# Patient Record
Sex: Female | Born: 1960 | Race: White | Hispanic: No | Smoking: Former smoker
Health system: Southern US, Community
[De-identification: ages and names within clinical notes are randomized; demographics above are authoritative.]

## PROBLEM LIST (undated history)

## (undated) DIAGNOSIS — B009 Herpesviral infection, unspecified: Secondary | ICD-10-CM

## (undated) DIAGNOSIS — R0602 Shortness of breath: Secondary | ICD-10-CM

## (undated) DIAGNOSIS — C801 Malignant (primary) neoplasm, unspecified: Secondary | ICD-10-CM

## (undated) DIAGNOSIS — F419 Anxiety disorder, unspecified: Secondary | ICD-10-CM

## (undated) DIAGNOSIS — J310 Chronic rhinitis: Secondary | ICD-10-CM

## (undated) DIAGNOSIS — F411 Generalized anxiety disorder: Secondary | ICD-10-CM

## (undated) DIAGNOSIS — I10 Essential (primary) hypertension: Secondary | ICD-10-CM

## (undated) DIAGNOSIS — R0789 Other chest pain: Secondary | ICD-10-CM

## (undated) HISTORY — PX: WISDOM TOOTH EXTRACTION: SHX21

## (undated) HISTORY — DX: Other chest pain: R07.89

## (undated) HISTORY — DX: Generalized anxiety disorder: F41.1

## (undated) HISTORY — DX: Anxiety disorder, unspecified: F41.9

## (undated) HISTORY — PX: TONSILLECTOMY: SUR1361

## (undated) HISTORY — DX: Shortness of breath: R06.02

## (undated) HISTORY — DX: Herpesviral infection, unspecified: B00.9

## (undated) HISTORY — PX: KNEE SURGERY: SHX244

## (undated) HISTORY — DX: Chronic rhinitis: J31.0

## (undated) HISTORY — PX: ABDOMINAL HYSTERECTOMY: SHX81

## (undated) HISTORY — DX: Essential (primary) hypertension: I10

---

## 1998-06-27 ENCOUNTER — Emergency Department (HOSPITAL_COMMUNITY): Admission: EM | Admit: 1998-06-27 | Discharge: 1998-06-27 | Payer: Self-pay | Admitting: Emergency Medicine

## 1998-06-28 ENCOUNTER — Encounter: Payer: Self-pay | Admitting: Emergency Medicine

## 1998-06-28 ENCOUNTER — Ambulatory Visit (HOSPITAL_COMMUNITY): Admission: RE | Admit: 1998-06-28 | Discharge: 1998-06-28 | Payer: Self-pay | Admitting: Emergency Medicine

## 1998-11-20 ENCOUNTER — Other Ambulatory Visit: Admission: RE | Admit: 1998-11-20 | Discharge: 1998-11-20 | Payer: Self-pay | Admitting: Obstetrics and Gynecology

## 1998-12-20 ENCOUNTER — Ambulatory Visit (HOSPITAL_COMMUNITY): Admission: RE | Admit: 1998-12-20 | Discharge: 1998-12-20 | Payer: Self-pay | Admitting: Obstetrics and Gynecology

## 1998-12-20 ENCOUNTER — Encounter (INDEPENDENT_AMBULATORY_CARE_PROVIDER_SITE_OTHER): Payer: Self-pay

## 1999-02-06 ENCOUNTER — Inpatient Hospital Stay (HOSPITAL_COMMUNITY): Admission: RE | Admit: 1999-02-06 | Discharge: 1999-02-09 | Payer: Self-pay | Admitting: Obstetrics and Gynecology

## 1999-02-06 ENCOUNTER — Encounter (INDEPENDENT_AMBULATORY_CARE_PROVIDER_SITE_OTHER): Payer: Self-pay | Admitting: Specialist

## 2000-07-07 ENCOUNTER — Other Ambulatory Visit: Admission: RE | Admit: 2000-07-07 | Discharge: 2000-07-07 | Payer: Self-pay | Admitting: Obstetrics and Gynecology

## 2003-02-23 ENCOUNTER — Other Ambulatory Visit: Admission: RE | Admit: 2003-02-23 | Discharge: 2003-02-23 | Payer: Self-pay | Admitting: Obstetrics and Gynecology

## 2007-12-02 ENCOUNTER — Encounter: Admission: RE | Admit: 2007-12-02 | Discharge: 2007-12-02 | Payer: Self-pay | Admitting: Family Medicine

## 2007-12-03 ENCOUNTER — Ambulatory Visit (HOSPITAL_COMMUNITY): Admission: RE | Admit: 2007-12-03 | Discharge: 2007-12-03 | Payer: Self-pay | Admitting: Gastroenterology

## 2008-09-27 ENCOUNTER — Encounter: Admission: RE | Admit: 2008-09-27 | Discharge: 2008-09-27 | Payer: Self-pay | Admitting: Family Medicine

## 2010-07-22 ENCOUNTER — Encounter: Payer: Self-pay | Admitting: Family Medicine

## 2012-08-25 ENCOUNTER — Encounter (HOSPITAL_BASED_OUTPATIENT_CLINIC_OR_DEPARTMENT_OTHER): Payer: Self-pay | Admitting: Family Medicine

## 2012-08-25 ENCOUNTER — Emergency Department (HOSPITAL_BASED_OUTPATIENT_CLINIC_OR_DEPARTMENT_OTHER)
Admission: EM | Admit: 2012-08-25 | Discharge: 2012-08-25 | Disposition: A | Discharge status: Home / Self Care | Payer: Federal, State, Local not specified - PPO | Attending: Emergency Medicine | Admitting: Emergency Medicine

## 2012-08-25 ENCOUNTER — Emergency Department (HOSPITAL_BASED_OUTPATIENT_CLINIC_OR_DEPARTMENT_OTHER): Payer: Federal, State, Local not specified - PPO

## 2012-08-25 DIAGNOSIS — F17200 Nicotine dependence, unspecified, uncomplicated: Secondary | ICD-10-CM | POA: Insufficient documentation

## 2012-08-25 DIAGNOSIS — M549 Dorsalgia, unspecified: Secondary | ICD-10-CM

## 2012-08-25 DIAGNOSIS — Z859 Personal history of malignant neoplasm, unspecified: Secondary | ICD-10-CM | POA: Insufficient documentation

## 2012-08-25 DIAGNOSIS — M545 Low back pain, unspecified: Secondary | ICD-10-CM | POA: Insufficient documentation

## 2012-08-25 DIAGNOSIS — Z90710 Acquired absence of both cervix and uterus: Secondary | ICD-10-CM | POA: Insufficient documentation

## 2012-08-25 DIAGNOSIS — Z79899 Other long term (current) drug therapy: Secondary | ICD-10-CM | POA: Insufficient documentation

## 2012-08-25 HISTORY — DX: Malignant (primary) neoplasm, unspecified: C80.1

## 2012-08-25 LAB — URINALYSIS, ROUTINE W REFLEX MICROSCOPIC
Glucose, UA: NEGATIVE mg/dL
Leukocytes, UA: NEGATIVE
Nitrite: NEGATIVE
Protein, ur: NEGATIVE mg/dL
pH: 7 (ref 5.0–8.0)

## 2012-08-25 LAB — CBC WITH DIFFERENTIAL/PLATELET
Basophils Absolute: 0 10*3/uL (ref 0.0–0.1)
Basophils Relative: 0 % (ref 0–1)
Lymphocytes Relative: 31 % (ref 12–46)
Neutro Abs: 5.1 10*3/uL (ref 1.7–7.7)
Platelets: 253 10*3/uL (ref 150–400)
RDW: 11.9 % (ref 11.5–15.5)
WBC: 8.4 10*3/uL (ref 4.0–10.5)

## 2012-08-25 LAB — BASIC METABOLIC PANEL
CO2: 27 mEq/L (ref 19–32)
Calcium: 10 mg/dL (ref 8.4–10.5)
Chloride: 103 mEq/L (ref 96–112)
GFR calc Af Amer: >90 mL/min (ref >90)
Sodium: 141 mEq/L (ref 135–145)

## 2012-08-25 MED ORDER — IBUPROFEN 800 MG PO TABS: 800.0000 mg | ORAL_TABLET | Freq: Three times a day (TID) | ORAL | Status: AC

## 2012-08-25 MED ORDER — ONDANSETRON HCL 4 MG/2ML IJ SOLN
4.0000 mg | Freq: Once | INTRAMUSCULAR | Status: AC
  Administered 2012-08-25: 4 mg via INTRAVENOUS
  Filled 2012-08-25: qty 2

## 2012-08-25 MED ORDER — CYCLOBENZAPRINE HCL 10 MG PO TABS: 10.0000 mg | ORAL_TABLET | Freq: Two times a day (BID) | ORAL | Status: DC | PRN

## 2012-08-25 MED ORDER — TRAMADOL HCL 50 MG PO TABS: 50.0000 mg | ORAL_TABLET | Freq: Four times a day (QID) | ORAL | Status: DC | PRN

## 2012-08-25 MED ORDER — SODIUM CHLORIDE 0.9 % IV SOLN
INTRAVENOUS | Status: DC
  Administered 2012-08-25: 10:00:00 via INTRAVENOUS

## 2012-08-25 MED ORDER — MORPHINE SULFATE 4 MG/ML IJ SOLN
4.0000 mg | Freq: Once | INTRAMUSCULAR | Status: AC
  Administered 2012-08-25: 4 mg via INTRAVENOUS
  Filled 2012-08-25: qty 1

## 2012-08-25 NOTE — ED Provider Notes (Signed)
History     CSN: [id number]  Arrival date & time 08/25/12  1191   First MD Initiated Contact with Patient 08/25/12 [phone]      Chief Complaint  Patient presents with  . Flank Pain    (Consider location/radiation/quality/duration/timing/severity/associated sxs/prior treatment) HPI Comments: Patient presents to the ER for evaluation of left lower back pain for 2 days. Patient denies any injury. She has noticed that certain positions exacerbate the pain. Pain does not radiate to the leg, but does come around and the left lower abdominal area. She has not had any nausea, vomiting or diarrhea. She denies fever. She has not noticed any urinary symptoms. Pain is worse today than it has been, sharp and stabbing.  Patient is a 52 y.o. female presenting with flank pain.  Flank Pain    Past Medical History  Diagnosis Date  . Cancer     Past Surgical History  Procedure Laterality Date  . Abdominal hysterectomy      No family history on file.  History  Substance Use Topics  . Smoking status: Current Some Day Smoker  . Smokeless tobacco: Not on file  . Alcohol Use: Yes    OB History   Grav Para Term Preterm Abortions TAB SAB Ect Mult Living                  Review of Systems  Genitourinary: Positive for flank pain.  Musculoskeletal: Positive for back pain.  All other systems reviewed and are negative.    Allergies  Penicillins and Sulfa antibiotics  Home Medications   Current Outpatient Rx  Name  Route  Sig  Dispense  Refill  . loratadine (CLARITIN) 10 MG tablet   Oral   Take 10 mg by mouth daily.           BP 144/102  Pulse 104  Temp(Src) 97.7 F (36.5 C) (Oral)  Resp 16  SpO2 98%  Physical Exam  Constitutional: She is oriented to person, place, and time. She appears well-developed and well-nourished. No distress.  HENT:  Head: Normocephalic and atraumatic.  Right Ear: Hearing normal.  Nose: Nose normal.  Mouth/Throat: Oropharynx is clear and moist  and mucous membranes are normal.  Eyes: Conjunctivae and EOM are normal. Pupils are equal, round, and reactive to light.  Neck: Normal range of motion. Neck supple.  Cardiovascular: Normal rate, regular rhythm, S1 normal and S2 normal.  Exam reveals no gallop and no friction rub.   No murmur heard. Pulmonary/Chest: Effort normal and breath sounds normal. No respiratory distress. She exhibits no tenderness.  Abdominal: Soft. Normal appearance and bowel sounds are normal. There is no hepatosplenomegaly. There is no tenderness. There is no rebound, no guarding, no tenderness at McBurney's point and negative Murphy's sign. No hernia.  Musculoskeletal: Normal range of motion.       Left hip: She exhibits no tenderness and no deformity.       Legs: Neurological: She is alert and oriented to person, place, and time. She has normal strength. No cranial nerve deficit or sensory deficit. Coordination normal. GCS eye subscore is 4. GCS verbal subscore is 5. GCS motor subscore is 6.  Skin: Skin is warm, dry and intact. No rash noted. No cyanosis.  Psychiatric: She has a normal mood and affect. Her speech is normal and behavior is normal. Thought content normal.    ED Course  Procedures (including critical care time)  Labs Reviewed  BASIC METABOLIC PANEL - Abnormal; Notable for the  following:    GFR calc non Af Amer 82 (*)    All other components within normal limits  CBC WITH DIFFERENTIAL  URINALYSIS, ROUTINE W REFLEX MICROSCOPIC   Ct Abdomen Pelvis Wo Contrast  08/25/2012  *RADIOLOGY REPORT*  Clinical Data: Left flank pain.  CT ABDOMEN AND PELVIS WITHOUT CONTRAST  Technique:  Multidetector CT imaging of the abdomen and pelvis was performed following the standard protocol without intravenous contrast.  Comparison: None.  Findings: Lung bases show no acute findings.  Heart size normal. No pericardial or pleural effusion.  Liver, gallbladder, adrenal glands, kidneys, spleen, pancreas, stomach and bowel  are unremarkable.  No urinary stones or obstruction.  No free fluid.  No pathologically enlarged lymph nodes.  Minimal scattered atherosclerotic calcification of the arterial vasculature without abdominal aortic aneurysm.  No worrisome lytic or sclerotic lesions.  IMPRESSION: Negative exam.   Original Report Authenticated By: Leanna Battles, M.D.      Diagnosis: Back pain    MDM  Patient presents to the ER for evaluation of left lower back pain has been present for 2 days. Symptoms are worsening. Patient has had some exacerbation with changes in position which supports the diagnosis of musculoskeletal pain. Pain does, however, radiate around towards the front as well. There was some consideration for kidney stone. Urinalysis and lab work were unremarkable. CT scan was performed and no acute abnormalities were seen. Patient treated with morphine here in the ER will be treated with continued analgesia and rest as an outpatient.        Gilda Crease, MD 08/25/12 1051

## 2012-08-25 NOTE — ED Notes (Signed)
Pt c/o left flank pain x 2 days. Denies injury, denies urinary symptoms, denies n/v.

## 2013-04-28 ENCOUNTER — Other Ambulatory Visit: Payer: Self-pay | Admitting: Family Medicine

## 2013-04-28 ENCOUNTER — Other Ambulatory Visit: Payer: Self-pay

## 2013-04-28 DIAGNOSIS — Z1231 Encounter for screening mammogram for malignant neoplasm of breast: Secondary | ICD-10-CM

## 2013-06-01 ENCOUNTER — Ambulatory Visit: Payer: Federal, State, Local not specified - PPO

## 2018-04-30 ENCOUNTER — Telehealth: Payer: Self-pay

## 2018-04-30 NOTE — Telephone Encounter (Signed)
SENT REFERRAL TO SCHEDULING AND FILED NOTES 

## 2018-05-04 ENCOUNTER — Encounter: Payer: Self-pay | Admitting: Cardiovascular Disease

## 2018-05-05 ENCOUNTER — Encounter (INDEPENDENT_AMBULATORY_CARE_PROVIDER_SITE_OTHER): Payer: Self-pay

## 2018-05-05 ENCOUNTER — Encounter: Payer: Self-pay | Admitting: Cardiovascular Disease

## 2018-05-05 ENCOUNTER — Ambulatory Visit: Payer: Federal, State, Local not specified - PPO | Admitting: Cardiovascular Disease

## 2018-05-05 VITALS — BP 132/80 | HR 64 | Ht 64.0 in | Wt 167.4 lb

## 2018-05-05 DIAGNOSIS — R072 Precordial pain: Secondary | ICD-10-CM | POA: Diagnosis not present

## 2018-05-05 LAB — BASIC METABOLIC PANEL
BUN/Creatinine Ratio: 25 — ABNORMAL HIGH (ref 9–23)
BUN: 20 mg/dL (ref 6–24)
CO2: 26 mmol/L (ref 20–29)
CREATININE: 0.81 mg/dL (ref 0.57–1.00)
Calcium: 9.9 mg/dL (ref 8.7–10.2)
Chloride: 97 mmol/L (ref 96–106)
GFR calc Af Amer: 92 mL/min/{1.73_m2} (ref >59)
GFR calc non Af Amer: 80 mL/min/{1.73_m2} (ref >59)
GLUCOSE: 94 mg/dL (ref 65–99)
Potassium: 4.3 mmol/L (ref 3.5–5.2)
SODIUM: 137 mmol/L (ref 134–144)

## 2018-05-05 NOTE — Patient Instructions (Addendum)
Medication Instructions:  Your physician recommends that you continue on your current medications as directed. Please refer to the Current Medication list given to you today.  If you need a refill on your cardiac medications before your next appointment, please call your pharmacy.   Lab work: Lab work to be done today--BMP If you have labs (blood work) drawn today and your tests are completely normal, you will receive your results only by: Marland Kitchen MyChart Message (if you have MyChart) OR . A paper copy in the mail If you have any lab test that is abnormal or we need to change your treatment, we will call you to review the results.  Testing/Procedures: Your physician has requested that you have an echocardiogram. Echocardiography is a painless test that uses sound waves to create images of your heart. It provides your doctor with information about the size and shape of your heart and how well your heart's chambers and valves are working. This procedure takes approximately one hour. There are no restrictions for this procedure.  Your physician has requested that you have cardiac CT. Cardiac computed tomography (CT) is a painless test that uses an x-ray machine to take clear, detailed pictures of your heart. For further information please visit [email or web address]. Please follow instructions below.     Follow-Up:  Your physician recommends that you schedule a follow-up appointment in:  6-8 weeks with PA/NP on Dr. Camillia Herter care team    Any Other Special Instructions Will Be Listed Below (If Applicable). Please arrive at the [facility] main entrance of [facility] at    AM (30-45 minutes prior to test start time)  [facility] [place], [place] [address] [phone]  Proceed to the [facility] Radiology Department (First Floor).  Please follow these instructions carefully (unless otherwise directed):  Hold all erectile dysfunction medications at least  48 hours prior to test.  On the Night Before the Test: . Be sure to Drink plenty of water. . Do not consume any caffeinated/decaffeinated beverages or chocolate 12 hours prior to your test. . Do not take any antihistamines 12 hours prior to your test. . If you take Metformin do not take 24 hours prior to test. On the Day of the Test: . Drink plenty of water. Do not drink any water within one hour of the test. . Do not eat any food 4 hours prior to the test.   . Take 2 metoprolol tablets by mouth 2 hours prior to test . HOLD Hydrochlorothiazide morning of the test.         After the Test: . Drink plenty of water. . After receiving IV contrast, you may experience a mild flushed feeling. This is normal. . On occasion, you may experience a mild rash up to 24 hours after the test. This is not dangerous. If this occurs, you can take Benadryl 25 mg and increase your fluid intake. . If you experience trouble breathing, this can be serious. If it is severe call 911 IMMEDIATELY. If it is mild, please call our office. . If you take any of these medications: Glipizide/Metformin, Avandament, Glucavance, please do not take 48 hours after completing test.

## 2018-05-05 NOTE — Progress Notes (Signed)
Chief Complaint  Patient presents with  . New Patient (Initial Visit)    chest pain   History of Present Illness: 57 yo female with history of anxiety and HTN who is here today as a new consult referred by Bing Matter, PA-C for the evaluation of chest pain. She has no prior cardiac disease. She has been having a pressure in her chest at rest and with exertion. This is  associated sometimes after meals. She has been having dyspnea with exertion. No lower extremity edema. Her father and sister have CAD. Her mother had carotid artery disease. She smoked several cigarettes per day on/off for many years. She no longer smokes.   Primary Care Physician: Aletha Halim., PA-CKristen Mila Merry   Past Medical History:  Diagnosis Date  . Anxiety   . Cancer (HCC)    Ovarian  . Chest pressure   . GAD (generalized anxiety disorder)   . Herpes simplex type 1 infection   . Hypertension   . Rhinitis   . SOB (shortness of breath)     Past Surgical History:  Procedure Laterality Date  . ABDOMINAL HYSTERECTOMY    . KNEE SURGERY    . TONSILLECTOMY    . WISDOM TOOTH EXTRACTION      Current Outpatient Medications  Medication Sig Dispense Refill  . ALPRAZolam (XANAX) 0.5 MG tablet Take 0.5 mg by mouth 2 (two) times daily as needed for anxiety.    Marland Kitchen aspirin EC 81 MG tablet Take 81 mg by mouth daily.    Marland Kitchen doxycycline (VIBRAMYCIN) 100 MG capsule Take 100 mg by mouth 2 (two) times daily.    . hydrochlorothiazide (HYDRODIURIL) 25 MG tablet Take 25 mg by mouth daily.    Marland Kitchen ibuprofen (ADVIL,MOTRIN) 800 MG tablet Take 1 tablet (800 mg total) by mouth 3 (three) times daily. 21 tablet 0  . KRILL OIL PO Take by mouth.     . loratadine (CLARITIN) 10 MG tablet Take 10 mg by mouth daily.    . metoprolol succinate (TOPROL-XL) 50 MG 24 hr tablet Take 50 mg by mouth daily. Take with or immediately following a meal.     No current facility-administered medications for this visit.     Allergies    Allergen Reactions  . Penicillins   . Sulfa Antibiotics     Social History   Socioeconomic History  . Marital status: Widowed    Spouse name: Not on file  . Number of children: Not on file  . Years of education: Not on file  . Highest education level: Not on file  Occupational History  . Occupation: Retired from the [employer]  Social Needs  . Financial resource strain: Not on file  . Food insecurity:    Worry: Not on file    Inability: Not on file  . Transportation needs:    Medical: Not on file    Non-medical: Not on file  Tobacco Use  . Smoking status: Former Smoker    Packs/day: 0.20    Years: 10.00    Pack years: 2.00  . Smokeless tobacco: Never Used  Substance and Sexual Activity  . Alcohol use: Yes    Comment: social  . Drug use: No  . Sexual activity: Not on file  Lifestyle  . Physical activity:    Days per week: Not on file    Minutes per session: Not on file  . Stress: Not on file  Relationships  . Social connections:  Talks on phone: Not on file    Gets together: Not on file    Attends religious service: Not on file    Active member of club or organization: Not on file    Attends meetings of clubs or organizations: Not on file    Relationship status: Not on file  . Intimate partner violence:    Fear of current or ex partner: Not on file    Emotionally abused: Not on file    Physically abused: Not on file    Forced sexual activity: Not on file  Other Topics Concern  . Not on file  Social History Narrative  . Not on file    Family History  Problem Relation Age of Onset  . Atrial fibrillation Mother   . Heart attack Father 70  . Atrial fibrillation Sister     Review of Systems:  As stated in the HPI and otherwise negative.   BP 132/80   Pulse 64   Ht 5\' 4"  (1.626 m)   Wt 167 lb 6.4 oz (75.9 kg)   SpO2 99%   BMI 28.73 kg/m   Physical Examination: General: Well developed, well nourished, NAD  HEENT: OP clear, mucus  membranes moist  SKIN: warm, dry. No rashes. Neuro: No focal deficits  Musculoskeletal: Muscle strength 5/5 all ext  Psychiatric: Mood and affect normal  Neck: No JVD, no carotid bruits, no thyromegaly, no lymphadenopathy.  Lungs:Clear bilaterally, no wheezes, rhonci, crackles Cardiovascular: Regular rate and rhythm. No murmurs, gallops or rubs. Abdomen:Soft. Bowel sounds present. Non-tender.  Extremities: No lower extremity edema. Pulses are 2 + in the bilateral DP/PT.  EKG:  EKG is ordered today. The ekg ordered today demonstrates NSR, rate 64 bpm. Incomplete RBBB.   Recent Labs: No results found for requested labs within last 8760 hours.   Lipid Panel No results found for: CHOL, TRIG, HDL, CHOLHDL, VLDL, LDLCALC, LDLDIRECT   Wt Readings from Last 3 Encounters:  05/05/18 167 lb 6.4 oz (75.9 kg)     Other studies Reviewed: Additional studies/ records that were reviewed today include:  Review of the above records demonstrates:    Assessment and Plan:   1. Chest pain: Risk factors for CAD include HTN, tobacco use history and FH of CAD. Her chest pain and dyspnea may represent angina. Will arrange an echo to assess LV systolic function and exclude structural heart disease. Will arrange coronary CTA to exclude CAD.   Current medicines are reviewed at length with the patient today.  The patient does not have concerns regarding medicines.  The following changes have been made:  no change  Labs/ tests ordered today include:   Orders Placed This Encounter  Procedures  . CT CORONARY MORPH W/CTA COR W/SCORE W/CA W/CM &/OR WO/CM  . CT CORONARY FRACTIONAL FLOW RESERVE DATA PREP  . CT CORONARY FRACTIONAL FLOW RESERVE FLUID ANALYSIS  . Basic Metabolic Panel (BMET)  . EKG 12-Lead  . ECHOCARDIOGRAM COMPLETE     Disposition:   FU with me or office APP  in 6 weeks   Signed, Lauree Chandler, MD 05/05/2018 10:30 AM    [facility] [place],  [place], [place]  [address] Phone: [phone]; Fax: [phone]

## 2018-05-11 ENCOUNTER — Ambulatory Visit (HOSPITAL_COMMUNITY): Payer: Federal, State, Local not specified - PPO | Attending: Cardiology

## 2018-05-11 ENCOUNTER — Other Ambulatory Visit: Payer: Self-pay

## 2018-05-11 DIAGNOSIS — R072 Precordial pain: Secondary | ICD-10-CM | POA: Insufficient documentation

## 2018-05-21 ENCOUNTER — Ambulatory Visit (HOSPITAL_COMMUNITY): Payer: Federal, State, Local not specified - PPO

## 2018-05-21 ENCOUNTER — Ambulatory Visit (HOSPITAL_COMMUNITY)
Admission: RE | Admit: 2018-05-21 | Discharge: 2018-05-21 | Disposition: A | Discharge status: Home / Self Care | Payer: Federal, State, Local not specified - PPO | Source: Ambulatory Visit | Attending: Cardiovascular Disease | Admitting: Cardiovascular Disease

## 2018-05-21 DIAGNOSIS — R072 Precordial pain: Secondary | ICD-10-CM

## 2018-05-21 DIAGNOSIS — Z006 Encounter for examination for normal comparison and control in clinical research program: Secondary | ICD-10-CM

## 2018-05-21 MED ORDER — NITROGLYCERIN 0.4 MG SL SUBL
SUBLINGUAL_TABLET | SUBLINGUAL | Status: AC
  Filled 2018-05-21: qty 2

## 2018-05-21 MED ORDER — IOPAMIDOL (ISOVUE-370) INJECTION 76%
INTRAVENOUS | Status: AC
  Administered 2018-05-21: 80 mL
  Filled 2018-05-21: qty 100

## 2018-05-21 MED ORDER — NITROGLYCERIN 0.4 MG SL SUBL
0.8000 mg | SUBLINGUAL_TABLET | Freq: Once | SUBLINGUAL | Status: AC
  Administered 2018-05-21: 0.8 mg via SUBLINGUAL
  Filled 2018-05-21: qty 25

## 2018-05-21 NOTE — Research (Signed)
Subject met inclusion and exclusion criteria.  The informed consent form, study requirements and expectations were reviewed with the subject and questions and concerns were addressed prior to the signing of the consent form.  The subject verbalized understanding of the trial requirements.  The subject agreed to participate in the CADFEM trial and signed the informed consent.  The informed consent was obtained prior to performance of any protocol-specific procedures for the subject.  A copy of the signed informed consent was given to the subject and a copy was placed in the subject's medical record.

## 2018-06-16 ENCOUNTER — Ambulatory Visit: Payer: Federal, State, Local not specified - PPO | Admitting: Physician Assistant

## 2020-02-21 ENCOUNTER — Other Ambulatory Visit (HOSPITAL_COMMUNITY): Payer: Self-pay | Admitting: Gastroenterology

## 2020-02-21 ENCOUNTER — Other Ambulatory Visit: Payer: Self-pay | Admitting: Gastroenterology

## 2020-02-21 DIAGNOSIS — R1011 Right upper quadrant pain: Secondary | ICD-10-CM

## 2020-02-29 ENCOUNTER — Other Ambulatory Visit (HOSPITAL_COMMUNITY): Payer: Federal, State, Local not specified - PPO

## 2020-02-29 ENCOUNTER — Ambulatory Visit (HOSPITAL_COMMUNITY): Payer: Federal, State, Local not specified - PPO

## 2020-03-26 ENCOUNTER — Ambulatory Visit (HOSPITAL_COMMUNITY)
Admission: RE | Admit: 2020-03-26 | Discharge: 2020-03-26 | Disposition: A | Discharge status: Home / Self Care | Payer: Federal, State, Local not specified - PPO | Source: Ambulatory Visit | Attending: Gastroenterology | Admitting: Gastroenterology

## 2020-03-26 ENCOUNTER — Other Ambulatory Visit: Payer: Self-pay

## 2020-03-26 DIAGNOSIS — R1011 Right upper quadrant pain: Secondary | ICD-10-CM | POA: Diagnosis not present

## 2020-03-26 MED ORDER — TECHNETIUM TC 99M MEBROFENIN IV KIT
4.8000 | PACK | Freq: Once | INTRAVENOUS | Status: AC | PRN
  Administered 2020-03-26: 4.8 via INTRAVENOUS

## 2020-05-22 NOTE — Addendum Note (Signed)
Encounter addended by: Yasiel Goyne, [place] Z on: 05/22/2020 2:13 PM  Actions taken: Imaging Exam ended, Charge Capture section accepted

## 2020-08-01 ENCOUNTER — Other Ambulatory Visit: Payer: Self-pay | Admitting: Surgery

## 2020-08-15 NOTE — Progress Notes (Signed)
Surgical Instructions    Your procedure is scheduled on 08/21/20.  Report to [facility] Main Entrance "A" at 10:45 A.M., then check in with the Admitting office.  Call this number if you have problems the morning of surgery:  [phone]   If you have any questions prior to your surgery date call [phone]: Open Monday-Friday 8am-4pm    Remember:  Do not eat after midnight the night before your surgery  You may drink clear liquids until 09:45am the morning of your surgery.   Clear liquids allowed are: Water, Non-Citrus Juices (without pulp), Carbonated Beverages, Clear Tea, Black Coffee Only, and Gatorade  Patient Instructions  . The night before surgery:  o No food after midnight. ONLY clear liquids after midnight  . The day of surgery (if you do NOT have diabetes):  o Drink ONE (1) Pre-Surgery Clear Ensure by 09:45am. Drink in one sitting. Do not sip.  o This drink was given to you during your hospital  pre-op appointment visit.  o Nothing else to drink after completing the  Pre-Surgery Clear Ensure.          If you have questions, please contact your surgeon's office.     Take these medicines the morning of surgery with A SIP OF WATER  ALPRAZolam (XANAX) if needed metoprolol succinate (TOPROL-XL)  Claritin omeprazole (PRILOSEC)  As of today, STOP taking any Aspirin (unless otherwise instructed by your surgeon) Aleve, Naproxen, Ibuprofen, Motrin, Advil, Goody's, BC's, all herbal medications, fish oil, and all vitamins.                     Do not wear jewelry, make up, or nail polish            Do not wear lotions, powders, perfumes/colognes, or deodorant.            Do not shave 48 hours prior to surgery.              Do not bring valuables to the hospital.            [facility] is not responsible for any belongings or valuables.  Do NOT Smoke (Tobacco/Vaping) or drink Alcohol 24 hours prior to your procedure If you use a CPAP at night, you may bring all  equipment for your overnight stay.   Contacts, glasses, dentures or bridgework may not be worn into surgery, please bring cases for these belongings   For patients admitted to the hospital, discharge time will be determined by your treatment team.   Patients discharged the day of surgery will not be allowed to drive home, and someone needs to stay with them for 24 hours.    Special instructions:   [place]- Preparing For Surgery  Before surgery, you can play an important role. Because skin is not sterile, your skin needs to be as free of germs as possible. You can reduce the number of germs on your skin by washing with CHG (chlorahexidine gluconate) Soap before surgery.  CHG is an antiseptic cleaner which kills germs and bonds with the skin to continue killing germs even after washing.    Oral Hygiene is also important to reduce your risk of infection.  Remember - BRUSH YOUR TEETH THE MORNING OF SURGERY WITH YOUR REGULAR TOOTHPASTE  Please do not use if you have an allergy to CHG or antibacterial soaps. If your skin becomes reddened/irritated stop using the CHG.  Do not shave (including legs and underarms) for at least  48 hours prior to first CHG shower. It is OK to shave your face.  Please follow these instructions carefully.   1. Shower the NIGHT BEFORE SURGERY and the MORNING OF SURGERY  2. If you chose to wash your hair, wash your hair first as usual with your normal shampoo.  3. After you shampoo, rinse your hair and body thoroughly to remove the shampoo.  4. Wash Face and genitals (private parts) with your normal soap.   5.  Shower the NIGHT BEFORE SURGERY and the MORNING OF SURGERY with CHG Soap.   6. Use CHG Soap as you would any other liquid soap. You can apply CHG directly to the skin and wash gently with a scrungie or a clean washcloth.   7. Apply the CHG Soap to your body ONLY FROM THE NECK DOWN.  Do not use on open wounds or open sores. Avoid contact with your eyes,  ears, mouth and genitals (private parts). Wash Face and genitals (private parts)  with your normal soap.   8. Wash thoroughly, paying special attention to the area where your surgery will be performed.  9. Thoroughly rinse your body with warm water from the neck down.  10. DO NOT shower/wash with your normal soap after using and rinsing off the CHG Soap.  11. Pat yourself dry with a CLEAN TOWEL.  12. Wear CLEAN PAJAMAS to bed the night before surgery  13. Place CLEAN SHEETS on your bed the night before your surgery  14. DO NOT SLEEP WITH PETS.   Day of Surgery: Take a shower.  Wear Clean/Comfortable clothing the morning of surgery Do not apply any deodorants/lotions.   Remember to brush your teeth WITH YOUR REGULAR TOOTHPASTE.   Please read over the following fact sheets that you were given.

## 2020-08-16 ENCOUNTER — Other Ambulatory Visit: Payer: Self-pay

## 2020-08-16 ENCOUNTER — Encounter (HOSPITAL_COMMUNITY): Payer: Self-pay

## 2020-08-16 ENCOUNTER — Encounter (HOSPITAL_COMMUNITY)
Admission: RE | Admit: 2020-08-16 | Discharge: 2020-08-16 | Disposition: A | Discharge status: Home / Self Care | Payer: Federal, State, Local not specified - PPO | Source: Ambulatory Visit | Attending: Surgery | Admitting: Surgery

## 2020-08-16 DIAGNOSIS — I10 Essential (primary) hypertension: Secondary | ICD-10-CM | POA: Diagnosis not present

## 2020-08-16 DIAGNOSIS — Z01818 Encounter for other preprocedural examination: Secondary | ICD-10-CM | POA: Diagnosis present

## 2020-08-16 LAB — CBC
HCT: 38.1 % (ref 36.0–46.0)
Hemoglobin: 13 g/dL (ref 12.0–15.0)
MCH: 31.8 pg (ref 26.0–34.0)
MCHC: 34.1 g/dL (ref 30.0–36.0)
MCV: 93.2 fL (ref 80.0–100.0)
Platelets: 283 10*3/uL (ref 150–400)
RBC: 4.09 MIL/uL (ref 3.87–5.11)
RDW: 12.2 % (ref 11.5–15.5)
WBC: 8.3 10*3/uL (ref 4.0–10.5)
nRBC: 0 % (ref 0.0–0.2)

## 2020-08-16 LAB — BASIC METABOLIC PANEL
Anion gap: 9 (ref 5–15)
BUN: 13 mg/dL (ref 8–23)
CO2: 29 mmol/L (ref 22–32)
Calcium: 10 mg/dL (ref 8.9–10.3)
Chloride: 96 mmol/L — ABNORMAL LOW (ref 98–111)
Creatinine, Ser: 0.83 mg/dL (ref 0.44–1.00)
GFR, Estimated: >60 mL/min (ref >60)
Glucose, Bld: 107 mg/dL — ABNORMAL HIGH (ref 70–99)
Potassium: 4.3 mmol/L (ref 3.5–5.1)
Sodium: 134 mmol/L — ABNORMAL LOW (ref 135–145)

## 2020-08-16 NOTE — Progress Notes (Signed)
PCP:  Bing Matter, [facility] Cardiologist:  Lauree Chandler, MD  EKG:  08/16/20 CXR:  N/A ECHO:  05/11/18 Stress Test:  Denies Cardiac Cath:  Denies  Fasting Blood Sugar- na Checks Blood Sugar_na__ times a day  OSA/CPAP:  No  ASA:  Stop 08/16/20.  Self-prescribed Blood Thinners:  No  Covid test 08/17/20  Anesthesia Review:  No.  Patient had echo for chest pain after husband died but patient said it was caused from anxiety.   Patient denies shortness of breath, fever, cough, and chest pain at PAT appointment.  Patient verbalized understanding of instructions provided today at the PAT appointment.  Patient asked to review instructions at home and day of surgery.

## 2020-08-17 ENCOUNTER — Other Ambulatory Visit (HOSPITAL_COMMUNITY)
Admission: RE | Admit: 2020-08-17 | Discharge: 2020-08-17 | Disposition: A | Discharge status: Home / Self Care | Payer: Federal, State, Local not specified - PPO | Source: Ambulatory Visit | Attending: Surgery | Admitting: Surgery

## 2020-08-17 DIAGNOSIS — Z01812 Encounter for preprocedural laboratory examination: Secondary | ICD-10-CM | POA: Diagnosis not present

## 2020-08-17 DIAGNOSIS — Z20822 Contact with and (suspected) exposure to covid-19: Secondary | ICD-10-CM | POA: Diagnosis not present

## 2020-08-17 LAB — SARS CORONAVIRUS 2 (TAT 6-24 HRS): SARS Coronavirus 2: NEGATIVE

## 2020-08-20 NOTE — H&P (Signed)
Mariah Allison Appointment: 08/01/2020 10:20 AM Location: [facility] Patient #: [id number] DOB: 1960-12-28 Married / Language: English / Race: White Female   History of Present Illness (Nashali Ditmer A. Ninfa Linden MD; 08/01/2020 10:43 AM) The patient is a 60 year old female who presents with abdominal pain.  Chief complaint: Right upper quadrant abdominal pain  This is a 60 year old female referred by Dr. Collene Mares for evaluation right quadrant abdominal pain and biliary dyskinesia. For several years she is having increasing right upper quadrant abdominal pain with nausea and bloating after meals. Late last year she had an ultrasound which was unremarkable for gallstones. She had a HIDA scan showing a 33% gallbladder ejection fraction. She has since had an unremarkable upper endoscopy. Her pain is moderate in intensity. Her attacks are happening almost daily now. She has no emesis. She has constipation. She is otherwise without complaints. She has no cardiopulmonary issues. She describes the pain as sharp and moderate.   Past Surgical History Hortencia Conradi, CMA; 08/01/2020 10:16 AM) Breast Augmentation  Bilateral. Hysterectomy (due to cancer) - Complete  Knee Surgery  Left. Oral Surgery   Diagnostic Studies History Hortencia Conradi, CMA; 08/01/2020 10:16 AM) Colonoscopy  1-5 years ago Mammogram  1-3 years ago Pap Smear  >5 years ago  Allergies (Kheana Marshall-McBride, CMA; 08/01/2020 10:17 AM) No Known Drug Allergies  [08/01/2020]: No Known Allergies  [08/01/2020]: Allergies Reconciled   Medication History (Kheana Marshall-McBride, CMA; 08/01/2020 10:18 AM) ALPRAZolam (0.5MG  Tablet, Oral) Active. hydroCHLOROthiazide (25MG  Tablet, Oral) Active. Metoprolol Succinate ER (50MG  Tablet ER 24HR, Oral) Active. Omeprazole (40MG  Capsule DR, Oral) Active. Medications Reconciled  Social History Hortencia Conradi, CMA; 08/01/2020 10:16 AM) Alcohol use   Occasional alcohol use. Caffeine use  Coffee. No drug use  Tobacco use  Former smoker.  Family History Hortencia Conradi, CMA; 08/01/2020 10:16 AM) Breast Cancer  Sister. Diabetes Mellitus  Sister. Heart Disease  Father, Mother, Sister. Hypertension  Brother, Father, Mother. Kidney Disease  Father, Sister. Prostate Cancer  Father. Thyroid problems  Mother, Sister.  Pregnancy / Birth History Hortencia Conradi, CMA; 08/01/2020 10:16 AM) Age at menarche  17 years. Age of menopause  <43 Gravida  2 Length (months) of breastfeeding  3-6 Maternal age  73-20 Para  2  Other Problems Hortencia Conradi, CMA; 08/01/2020 10:16 AM) Cervical Cancer  Gastroesophageal Reflux Disease  High blood pressure  Oophorectomy     Review of Systems (Kheana Marshall-McBride CMA; 08/01/2020 10:17 AM) Skin Not Present- Change in Wart/Mole, Dryness, Hives, Jaundice, New Lesions, Non-Healing Wounds, Rash and Ulcer. HEENT Present- Ringing in the Ears. Not Present- Earache, Hearing Loss, Hoarseness, Nose Bleed, Oral Ulcers, Seasonal Allergies, Sinus Pain, Sore Throat, Visual Disturbances, Wears glasses/contact lenses and Yellow Eyes. Respiratory Not Present- Bloody sputum, Chronic Cough, Difficulty Breathing, Snoring and Wheezing. Breast Not Present- Breast Mass, Breast Pain, Nipple Discharge and Skin Changes. Cardiovascular Not Present- Chest Pain, Difficulty Breathing Lying Down, Leg Cramps, Palpitations, Rapid Heart Rate, Shortness of Breath and Swelling of Extremities. Gastrointestinal Present- Abdominal Pain, Indigestion and Nausea. Not Present- Bloating, Bloody Stool, Change in Bowel Habits, Chronic diarrhea, Constipation, Difficulty Swallowing, Excessive gas, Gets full quickly at meals, Hemorrhoids, Rectal Pain and Vomiting. Female Genitourinary Not Present- Frequency, Nocturia, Painful Urination, Pelvic Pain and Urgency. Musculoskeletal Not Present- Back Pain, Joint  Pain, Joint Stiffness, Muscle Pain, Muscle Weakness and Swelling of Extremities. Neurological Not Present- Decreased Memory, Fainting, Headaches, Numbness, Seizures, Tingling, Tremor, Trouble walking and Weakness. Psychiatric Not Present- Anxiety, Bipolar, Change in Sleep Pattern,  Depression, Fearful and Frequent crying. Endocrine Not Present- Cold Intolerance, Excessive Hunger, Hair Changes, Heat Intolerance, Hot flashes and New Diabetes. Hematology Present- Easy Bruising. Not Present- Blood Thinners, Excessive bleeding, Gland problems, HIV and Persistent Infections.  Vitals (Kheana Marshall-McBride CMA; 08/01/2020 10:18 AM) 08/01/2020 10:18 AM Weight: 174.25 lb Height: 64in Body Surface Area: 1.84 m Body Mass Index: 29.91 kg/m  Temp.: 97.3F  Pulse: 83 (Regular)  P.OX: 99% (Room air) BP: 128/78(Sitting, Left Arm, Standard)       Physical Exam (Kaison Mcparland A. Ninfa Linden MD; 08/01/2020 10:44 AM) The physical exam findings are as follows: Note: She appears well exam  Her abdomen is soft. There is some tenderness with guarding in the right upper quadrant. There is no hepatomegaly. There is no jaundice    Assessment & Plan (Melida Northington A. Ninfa Linden MD; 08/01/2020 10:45 AM) BILIARY DYSKINESIA (K82.8) Impression: I reviewed her notes in the electronic medical records including her HIDA scan and ultrasound. I also reviewed her notes from Dr. Collene Mares. She has biliary dyskinesia and I suspect some mild chronic cholecystitis. I discussed the diagnosis with her in detail. We discussed continued conservative management versus proceeding with a laparoscopic cholecystectomy. She would like to proceed with surgery. I discussed the procedure in detail. The patient was given Neurosurgeon. We discussed the risks and benefits of a laparoscopic cholecystectomy and possible cholangiogram including, but not limited to, bleeding, infection, injury to surrounding structures such as the intestine or liver,  bile leak, retained gallstones, need to convert to an open procedure, prolonged diarrhea, blood clots such as DVT, common bile duct injury, anesthesia risks, and possible need for additional procedures. The likelihood of improvement in symptoms and return to the patient's normal status is good. We discussed the typical post-operative recovery course. All questions were answered.

## 2020-08-21 ENCOUNTER — Encounter (HOSPITAL_COMMUNITY)
Admission: RE | Disposition: A | Discharge status: Home / Self Care | Payer: Self-pay | Source: Ambulatory Visit | Attending: Surgery

## 2020-08-21 ENCOUNTER — Ambulatory Visit (HOSPITAL_COMMUNITY): Payer: Federal, State, Local not specified - PPO | Admitting: Anesthesiology

## 2020-08-21 ENCOUNTER — Other Ambulatory Visit: Payer: Self-pay

## 2020-08-21 ENCOUNTER — Ambulatory Visit (HOSPITAL_COMMUNITY)
Admission: RE | Admit: 2020-08-21 | Discharge: 2020-08-21 | Disposition: A | Discharge status: Home / Self Care | Payer: Federal, State, Local not specified - PPO | Source: Ambulatory Visit | Attending: Surgery | Admitting: Surgery

## 2020-08-21 ENCOUNTER — Encounter (HOSPITAL_COMMUNITY): Payer: Self-pay | Admitting: Surgery

## 2020-08-21 DIAGNOSIS — K5900 Constipation, unspecified: Secondary | ICD-10-CM | POA: Diagnosis not present

## 2020-08-21 DIAGNOSIS — K811 Chronic cholecystitis: Secondary | ICD-10-CM | POA: Diagnosis not present

## 2020-08-21 DIAGNOSIS — Z87891 Personal history of nicotine dependence: Secondary | ICD-10-CM | POA: Insufficient documentation

## 2020-08-21 DIAGNOSIS — K828 Other specified diseases of gallbladder: Secondary | ICD-10-CM | POA: Diagnosis present

## 2020-08-21 HISTORY — PX: CHOLECYSTECTOMY: SHX55

## 2020-08-21 SURGERY — LAPAROSCOPIC CHOLECYSTECTOMY
Anesthesia: General | Site: Abdomen

## 2020-08-21 MED ORDER — 0.9 % SODIUM CHLORIDE (POUR BTL) OPTIME
TOPICAL | Status: DC | PRN
  Administered 2020-08-21: 1000 mL

## 2020-08-21 MED ORDER — AMISULPRIDE (ANTIEMETIC) 5 MG/2ML IV SOLN
INTRAVENOUS | Status: AC
  Filled 2020-08-21: qty 4

## 2020-08-21 MED ORDER — TRAMADOL HCL 50 MG PO TABS: 50.0000 mg | ORAL_TABLET | Freq: Four times a day (QID) | ORAL | 0 refills | Status: DC | PRN

## 2020-08-21 MED ORDER — KETOROLAC TROMETHAMINE 15 MG/ML IJ SOLN
15.0000 mg | INTRAMUSCULAR | Status: AC
  Administered 2020-08-21: 15 mg via INTRAVENOUS
  Filled 2020-08-21: qty 1

## 2020-08-21 MED ORDER — MIDAZOLAM HCL 2 MG/2ML IJ SOLN
INTRAMUSCULAR | Status: AC
  Filled 2020-08-21: qty 2

## 2020-08-21 MED ORDER — BUPIVACAINE HCL (PF) 0.25 % IJ SOLN
INTRAMUSCULAR | Status: AC
  Filled 2020-08-21: qty 30

## 2020-08-21 MED ORDER — LACTATED RINGERS IV SOLN: INTRAVENOUS | Status: DC | PRN

## 2020-08-21 MED ORDER — PROPOFOL 10 MG/ML IV BOLUS
INTRAVENOUS | Status: DC | PRN
  Administered 2020-08-21: 130 mg via INTRAVENOUS

## 2020-08-21 MED ORDER — EPHEDRINE SULFATE-NACL 50-0.9 MG/10ML-% IV SOSY
PREFILLED_SYRINGE | INTRAVENOUS | Status: DC | PRN
  Administered 2020-08-21: 10 mg via INTRAVENOUS

## 2020-08-21 MED ORDER — SODIUM CHLORIDE 0.9 % IR SOLN
Status: DC | PRN
  Administered 2020-08-21: 1000 mL

## 2020-08-21 MED ORDER — ONDANSETRON HCL 4 MG/2ML IJ SOLN
INTRAMUSCULAR | Status: AC
  Filled 2020-08-21: qty 2

## 2020-08-21 MED ORDER — LIDOCAINE 2% (20 MG/ML) 5 ML SYRINGE
INTRAMUSCULAR | Status: DC | PRN
  Administered 2020-08-21: 60 mg via INTRAVENOUS

## 2020-08-21 MED ORDER — PROMETHAZINE HCL 25 MG/ML IJ SOLN
6.2500 mg | Freq: Once | INTRAMUSCULAR | Status: AC
  Administered 2020-08-21: 6.25 mg via INTRAVENOUS

## 2020-08-21 MED ORDER — ENSURE PRE-SURGERY PO LIQD: 296.0000 mL | Freq: Once | ORAL | Status: DC

## 2020-08-21 MED ORDER — CHLORHEXIDINE GLUCONATE CLOTH 2 % EX PADS: 6.0000 | MEDICATED_PAD | Freq: Once | CUTANEOUS | Status: DC

## 2020-08-21 MED ORDER — DEXAMETHASONE SODIUM PHOSPHATE 10 MG/ML IJ SOLN
INTRAMUSCULAR | Status: DC | PRN
  Administered 2020-08-21: 4 mg via INTRAVENOUS

## 2020-08-21 MED ORDER — SUGAMMADEX SODIUM 200 MG/2ML IV SOLN
INTRAVENOUS | Status: DC | PRN
  Administered 2020-08-21 (×2): 100 mg via INTRAVENOUS

## 2020-08-21 MED ORDER — FENTANYL CITRATE (PF) 100 MCG/2ML IJ SOLN
25.0000 ug | INTRAMUSCULAR | Status: DC | PRN
  Administered 2020-08-21 (×2): 50 ug via INTRAVENOUS

## 2020-08-21 MED ORDER — FENTANYL CITRATE (PF) 250 MCG/5ML IJ SOLN
INTRAMUSCULAR | Status: DC | PRN
  Administered 2020-08-21: 50 ug via INTRAVENOUS
  Administered 2020-08-21: 100 ug via INTRAVENOUS

## 2020-08-21 MED ORDER — BUPIVACAINE HCL (PF) 0.25 % IJ SOLN
INTRAMUSCULAR | Status: DC | PRN
  Administered 2020-08-21: 16 mL

## 2020-08-21 MED ORDER — ACETAMINOPHEN 500 MG PO TABS
1000.0000 mg | ORAL_TABLET | ORAL | Status: AC
  Administered 2020-08-21: 1000 mg via ORAL
  Filled 2020-08-21: qty 2

## 2020-08-21 MED ORDER — PROMETHAZINE HCL 25 MG/ML IJ SOLN
INTRAMUSCULAR | Status: AC
  Filled 2020-08-21: qty 1

## 2020-08-21 MED ORDER — CIPROFLOXACIN IN D5W 400 MG/200ML IV SOLN
400.0000 mg | INTRAVENOUS | Status: AC
  Administered 2020-08-21: 400 mg via INTRAVENOUS
  Filled 2020-08-21: qty 200

## 2020-08-21 MED ORDER — FENTANYL CITRATE (PF) 250 MCG/5ML IJ SOLN
INTRAMUSCULAR | Status: AC
  Filled 2020-08-21: qty 5

## 2020-08-21 MED ORDER — MIDAZOLAM HCL 5 MG/5ML IJ SOLN
INTRAMUSCULAR | Status: DC | PRN
  Administered 2020-08-21: 2 mg via INTRAVENOUS

## 2020-08-21 MED ORDER — ROCURONIUM BROMIDE 10 MG/ML (PF) SYRINGE
PREFILLED_SYRINGE | INTRAVENOUS | Status: DC | PRN
  Administered 2020-08-21: 40 mg via INTRAVENOUS

## 2020-08-21 MED ORDER — ROCURONIUM BROMIDE 10 MG/ML (PF) SYRINGE
PREFILLED_SYRINGE | INTRAVENOUS | Status: AC
  Filled 2020-08-21: qty 10

## 2020-08-21 MED ORDER — DEXAMETHASONE SODIUM PHOSPHATE 10 MG/ML IJ SOLN
INTRAMUSCULAR | Status: AC
  Filled 2020-08-21: qty 1

## 2020-08-21 MED ORDER — LIDOCAINE 2% (20 MG/ML) 5 ML SYRINGE
INTRAMUSCULAR | Status: AC
  Filled 2020-08-21: qty 5

## 2020-08-21 MED ORDER — ONDANSETRON HCL 4 MG/2ML IJ SOLN
INTRAMUSCULAR | Status: DC | PRN
  Administered 2020-08-21: 4 mg via INTRAVENOUS

## 2020-08-21 MED ORDER — FENTANYL CITRATE (PF) 100 MCG/2ML IJ SOLN
INTRAMUSCULAR | Status: AC
  Filled 2020-08-21: qty 2

## 2020-08-21 MED ORDER — AMISULPRIDE (ANTIEMETIC) 5 MG/2ML IV SOLN
10.0000 mg | Freq: Once | INTRAVENOUS | Status: AC | PRN
  Administered 2020-08-21: 10 mg via INTRAVENOUS

## 2020-08-21 MED ORDER — EPHEDRINE 5 MG/ML INJ
INTRAVENOUS | Status: AC
  Filled 2020-08-21: qty 10

## 2020-08-21 MED ORDER — TRAMADOL HCL 50 MG PO TABS
ORAL_TABLET | ORAL | Status: AC
  Administered 2020-08-21: 50 mg
  Filled 2020-08-21: qty 1

## 2020-08-21 SURGICAL SUPPLY — 32 items
APPLIER CLIP 5 13 M/L LIGAMAX5 (MISCELLANEOUS) ×2
CANISTER SUCT 3000ML PPV (MISCELLANEOUS) ×2 IMPLANT
CHLORAPREP W/TINT 26 (MISCELLANEOUS) ×2 IMPLANT
CLIP APPLIE 5 13 M/L LIGAMAX5 (MISCELLANEOUS) ×1 IMPLANT
COVER SURGICAL LIGHT HANDLE (MISCELLANEOUS) ×2 IMPLANT
COVER WAND RF STERILE (DRAPES) IMPLANT
DERMABOND ADVANCED (GAUZE/BANDAGES/DRESSINGS) ×1
DERMABOND ADVANCED .7 DNX12 (GAUZE/BANDAGES/DRESSINGS) ×1 IMPLANT
ELECT REM PT RETURN 9FT ADLT (ELECTROSURGICAL) ×2
ELECTRODE REM PT RTRN 9FT ADLT (ELECTROSURGICAL) ×1 IMPLANT
GLOVE SURG SIGNA 7.5 PF LTX (GLOVE) ×2 IMPLANT
GOWN STRL REUS W/ TWL LRG LVL3 (GOWN DISPOSABLE) ×3 IMPLANT
GOWN STRL REUS W/ TWL XL LVL3 (GOWN DISPOSABLE) ×1 IMPLANT
GOWN STRL REUS W/TWL LRG LVL3 (GOWN DISPOSABLE) ×6
GOWN STRL REUS W/TWL XL LVL3 (GOWN DISPOSABLE) ×2
KIT BASIN OR (CUSTOM PROCEDURE TRAY) ×2 IMPLANT
KIT TURNOVER KIT B (KITS) ×2 IMPLANT
NS IRRIG 1000ML POUR BTL (IV SOLUTION) ×2 IMPLANT
PAD ARMBOARD 7.5X6 YLW CONV (MISCELLANEOUS) ×2 IMPLANT
POUCH SPECIMEN RETRIEVAL 10MM (ENDOMECHANICALS) ×2 IMPLANT
SCISSORS LAP 5X35 DISP (ENDOMECHANICALS) ×2 IMPLANT
SET IRRIG TUBING LAPAROSCOPIC (IRRIGATION / IRRIGATOR) ×2 IMPLANT
SET TUBE SMOKE EVAC HIGH FLOW (TUBING) ×2 IMPLANT
SLEEVE ENDOPATH XCEL 5M (ENDOMECHANICALS) ×4 IMPLANT
SPECIMEN JAR SMALL (MISCELLANEOUS) ×2 IMPLANT
SUT MNCRL AB 4-0 PS2 18 (SUTURE) ×2 IMPLANT
TOWEL GREEN STERILE (TOWEL DISPOSABLE) ×2 IMPLANT
TOWEL GREEN STERILE FF (TOWEL DISPOSABLE) ×2 IMPLANT
TRAY LAPAROSCOPIC MC (CUSTOM PROCEDURE TRAY) ×2 IMPLANT
TROCAR XCEL BLUNT TIP 100MML (ENDOMECHANICALS) ×2 IMPLANT
TROCAR XCEL NON-BLD 5MMX100MML (ENDOMECHANICALS) ×2 IMPLANT
WATER STERILE IRR 1000ML POUR (IV SOLUTION) ×2 IMPLANT

## 2020-08-21 NOTE — Op Note (Signed)
PATIENT: Mariah Allison is a 60 yo female  PRE-OPERATIVE DIAGNOSIS:Biliary dyskinesia  POST-OPERATIVE DIAGNOSIS:Biliary dyskinesia  PROCEDURE:  LAPAROSCOPIC CHOLECYSTECTOMY (N/A)  SURGEON: Surgeon(s) and Role: Coralie Keens, MD  ASSISTANTS:Doryce Mcgregory, MD  ANESTHESIA:local and general  EBL:26mL   BLOOD ADMINISTERED:None  DRAINS:None  LOCAL MEDICATIONS USED: Lidocaine   SPECIMEN:Gallbladder  DISPOSITION OF SPECIMEN:PATHOLOGY  COUNTS:Sponge an instrument count was correct  DICTATION:  The patient was taken to the operating and placed in the supine positionin the operating room table. Pneumatic compression stockings were placed in bilateral lower extremities.The patient was prepped and draped in the usual sterile fashion. A time out wasperformed per hospital's policy. Antibiotics were administered as indicated. A pneumoperitoneum was obtained witha Hasson technique via a 2 cm midline infraumbilical incision to a pressure of 81mmHg.  A 5 mm trochar was then placed in the epigastricarea via a 5 mm incision. Two 5 mm ports were then placed in theright upper quadrant after infiltrating with local anesthesia under direct visualization.  The gallbladder was identified and retracted, the peritoneum was then sharply dissected from the gallbladder and this dissection was carried down to Calot's triangle. Thecystic ductwas identified and stripped away circumferentially and seen going into the gallbladder 360. The cystic artery was identified and dissected circumferentially and seen going in the gallbladder. The criticalview of safetywas obtained.Threeclips were placed proximally one distally and the cystic duct wastransected. The cystic artery was identified and twoclips placed proximally and one distally and transected.We then proceeded to remove the gallbladder off the hepatic fossa with Bovieelectrocautery. A  retrieval bag was then placed in the abdomen and thegallbladder wasplaced in the bag. The hepatic fossa was then reexamined and hemostasis was achieved with Bovieelectrocautery.  The gallbladder andretrievalbag were removed from the abdominal cavity. The 11 mm trocar fascia was reapproximated by tying down the vicryl suture.The pneumoperitoneum was evacuated and all trochars removed under direct visulalization. The skin was then closed with 4-0 Monocryl andDermabond.  The patient was awaken from general anesthesia and taken to the recovery room in stable condition.  PLAN OF CARE:Discharge to home after PACU  PATIENT DISPOSITION:PACU - hemodynamically stable.

## 2020-08-21 NOTE — Discharge Instructions (Signed)
[facility] ______[facility] °LAPAROSCOPIC SURGERY: POST OP INSTRUCTIONS °Always review your discharge instruction sheet given to you by the facility where your surgery was performed. °IF YOU HAVE DISABILITY OR FAMILY LEAVE FORMS, YOU MUST BRING THEM TO THE OFFICE FOR PROCESSING.   °DO NOT GIVE THEM TO YOUR DOCTOR. ° °1. A prescription for pain medication may be given to you upon discharge.  Take your pain medication as prescribed, if needed.  If narcotic pain medicine is not needed, then you may take acetaminophen (Tylenol) or ibuprofen (Advil) as needed. °2. Take your usually prescribed medications unless otherwise directed. °3. If you need a refill on your pain medication, please contact your pharmacy.  They will contact our office to request authorization. Prescriptions will not be filled after 5pm or on week-ends. °4. You should follow a light diet the first few days after arrival home, such as soup and crackers, etc.  Be sure to include lots of fluids daily. °5. Most patients will experience some swelling and bruising in the area of the incisions.  Ice packs will help.  Swelling and bruising can take several days to resolve.  °6. It is common to experience some constipation if taking pain medication after surgery.  Increasing fluid intake and taking a stool softener (such as Colace) will usually help or prevent this problem from occurring.  A mild laxative (Milk of Magnesia or Miralax) should be taken according to package instructions if there are no bowel movements after 48 hours. °7. Unless discharge instructions indicate otherwise, you may remove your bandages 24-48 hours after surgery, and you may shower at that time.  You may have steri-strips (small skin tapes) in place directly over the incision.  These strips should be left on the skin for 7-10 days.  If your surgeon used skin glue on the incision, you may shower in 24 hours.  The glue will flake off over the next 2-3 weeks.  Any sutures or  staples will be removed at the office during your follow-up visit. °8. ACTIVITIES:  You may resume regular (light) daily activities beginning the next day--such as daily self-care, walking, climbing stairs--gradually increasing activities as tolerated.  You may have sexual intercourse when it is comfortable.  Refrain from any heavy lifting or straining until approved by your doctor. °a. You may drive when you are no longer taking prescription pain medication, you can comfortably wear a seatbelt, and you can safely maneuver your car and apply brakes. °b. RETURN TO WORK:  __________________________________________________________ °9. You should see your doctor in the office for a follow-up appointment approximately 2-3 weeks after your surgery.  Make sure that you call for this appointment within a day or two after you arrive home to insure a convenient appointment time. °10. OTHER INSTRUCTIONS:OK TO SHOWER STARTING TOMORROW °11. ICE PACK, TYLENOL, AND IBUPROFEN ALSO FOR PAIN °12. NO LIFTING MORE THAN 15 TO 20 POUNDS FOR 2 WEEKS __________________________________________________________________________________________________________________________ __________________________________________________________________________________________________________________________ °WHEN TO CALL YOUR DOCTOR: °1. Fever over 101.0 °2. Inability to urinate °3. Continued bleeding from incision. °4. Increased pain, redness, or drainage from the incision. °5. Increasing abdominal pain ° °The clinic staff is available to answer your questions during regular business hours.  Please don’t hesitate to call and ask to speak to one of the nurses for clinical concerns.  If you have a medical emergency, go to the nearest emergency room or call 911.  A surgeon from [facility] is always on call at the hospital. °[address],   [address], [place], [place]  [address] ? [address] [address], [place], [place]   [address] °[phone] ?  [phone] ? FAX [phone] °Web site: [email or web address] °

## 2020-08-21 NOTE — Progress Notes (Signed)
Pt concerned re: chest discomfort and ongoing nausea/vomiting / dr r fitzgerald notified. Will see / remains sleepy after meds, now in a recliner

## 2020-08-21 NOTE — Transfer of Care (Signed)
Immediate Anesthesia Transfer of Care Note  Patient: Mariah Allison  Procedure(s) Performed: LAPAROSCOPIC CHOLECYSTECTOMY (N/A Abdomen)  Patient Location: PACU  Anesthesia Type:General  Level of Consciousness: drowsy and patient cooperative  Airway & Oxygen Therapy: Patient Spontanous Breathing and Patient connected to nasal cannula oxygen  Post-op Assessment: Report given to RN and Post -op Vital signs reviewed and stable  Post vital signs: Reviewed and stable  Last Vitals:  Vitals Value Taken Time  BP 141/83 08/21/20 1302  Temp    Pulse 62 08/21/20 1304  Resp 14 08/21/20 1304  SpO2 100 % 08/21/20 1304  Vitals shown include unvalidated device data.  Last Pain:  Vitals:   08/21/20 1026  TempSrc:   PainSc: 0-No pain         Complications: No complications documented.

## 2020-08-21 NOTE — Interval H&P Note (Signed)
History and Physical Interval Note:no change in H and P  08/21/2020 10:14 AM  Mariah Allison  has presented today for surgery, with the diagnosis of BILIARY DYSKINESIA.  The various methods of treatment have been discussed with the patient and family. After consideration of risks, benefits and other options for treatment, the patient has consented to  Procedure(s): LAPAROSCOPIC CHOLECYSTECTOMY (N/A) as a surgical intervention.  The patient's history has been reviewed, patient examined, no change in status, stable for surgery.  I have reviewed the patient's chart and labs.  Questions were answered to the patient's satisfaction.     Coralie Keens

## 2020-08-21 NOTE — Anesthesia Procedure Notes (Signed)
Procedure Name: Intubation Date/Time: 08/21/2020 12:14 PM Performed by: Renato Shin, CRNA Pre-anesthesia Checklist: Patient identified, Emergency Drugs available, Suction available and Patient being monitored Patient Re-evaluated:Patient Re-evaluated prior to induction Oxygen Delivery Method: Circle system utilized Preoxygenation: Pre-oxygenation with 100% oxygen Induction Type: IV induction Ventilation: Mask ventilation without difficulty Laryngoscope Size: Miller and 2 Grade View: Grade I Tube type: Oral Tube size: 7.0 mm Number of attempts: 1 Airway Equipment and Method: Stylet and Oral airway Placement Confirmation: ETT inserted through vocal cords under direct vision,  positive ETCO2 and breath sounds checked- equal and bilateral Secured at: 22 cm Tube secured with: Tape Dental Injury: Teeth and Oropharynx as per pre-operative assessment

## 2020-08-21 NOTE — Anesthesia Postprocedure Evaluation (Signed)
Anesthesia Post Note  Patient: Terika B Dulany  Procedure(s) Performed: LAPAROSCOPIC CHOLECYSTECTOMY (N/A Abdomen)     Patient location during evaluation: PACU Anesthesia Type: General Level of consciousness: awake and alert Pain management: pain level controlled Vital Signs Assessment: post-procedure vital signs reviewed and stable Respiratory status: spontaneous breathing, nonlabored ventilation, respiratory function stable and patient connected to nasal cannula oxygen Cardiovascular status: blood pressure returned to baseline and stable Postop Assessment: no apparent nausea or vomiting Anesthetic complications: no   No complications documented.  Last Vitals:  Vitals:   08/21/20 1349 08/21/20 1400  BP: 129/67   Pulse: 83 85  Resp: 13 11  Temp:    SpO2: 92% 90%    Last Pain:  Vitals:   08/21/20 1315  TempSrc:   PainSc: 5                  Tiajuana Amass

## 2020-08-21 NOTE — Anesthesia Preprocedure Evaluation (Signed)
Anesthesia Evaluation  Patient identified by MRN, date of birth, ID band Patient awake    Reviewed: Allergy & Precautions, NPO status , Patient's Chart, lab work & pertinent test results  Airway Mallampati: II  TM Distance: >3 FB     Dental  (+) Dental Advisory Given   Pulmonary former smoker,    breath sounds clear to auscultation       Cardiovascular hypertension, Pt. on medications and Pt. on home beta blockers  Rhythm:Regular Rate:Normal     Neuro/Psych negative neurological ROS     GI/Hepatic negative GI ROS, Neg liver ROS,   Endo/Other  negative endocrine ROS  Renal/GU negative Renal ROS     Musculoskeletal   Abdominal   Peds  Hematology negative hematology ROS (+)   Anesthesia Other Findings   Reproductive/Obstetrics                             Anesthesia Physical Anesthesia Plan  ASA: II  Anesthesia Plan: General   Post-op Pain Management:    Induction: Intravenous  PONV Risk Score and Plan: 3 and Midazolam, Dexamethasone, Ondansetron and Treatment may vary due to age or medical condition  Airway Management Planned: Oral ETT  Additional Equipment:   Intra-op Plan:   Post-operative Plan: Extubation in OR  Informed Consent: I have reviewed the patients History and Physical, chart, labs and discussed the procedure including the risks, benefits and alternatives for the proposed anesthesia with the patient or authorized representative who has indicated his/her understanding and acceptance.     Dental advisory given  Plan Discussed with: CRNA  Anesthesia Plan Comments:         Anesthesia Quick Evaluation

## 2020-08-21 NOTE — Progress Notes (Signed)
Spoek with Dr Alfonso Patten Ola Spurr re persistent nausea/vomiting / 2nd time @100cc  / orders rec'd

## 2020-08-21 NOTE — Progress Notes (Signed)
Dr Elgie Congo in to speak with pt / eval chest pain / feels related to surgical procedure / pt reassured

## 2020-08-22 ENCOUNTER — Encounter (HOSPITAL_COMMUNITY): Payer: Self-pay | Admitting: Surgery

## 2020-08-22 LAB — SURGICAL PATHOLOGY

## 2022-04-23 IMAGING — US US ABDOMEN LIMITED
1 series · 14 of 25 positions shown · non-contrast
Comparison: None.

CLINICAL DATA: Right upper quadrant pain.

EXAM:
ULTRASOUND ABDOMEN LIMITED RIGHT UPPER QUADRANT

[Series 1: us abdomen limited ruq · 14 of 39 slices shown]
[im 1/39]
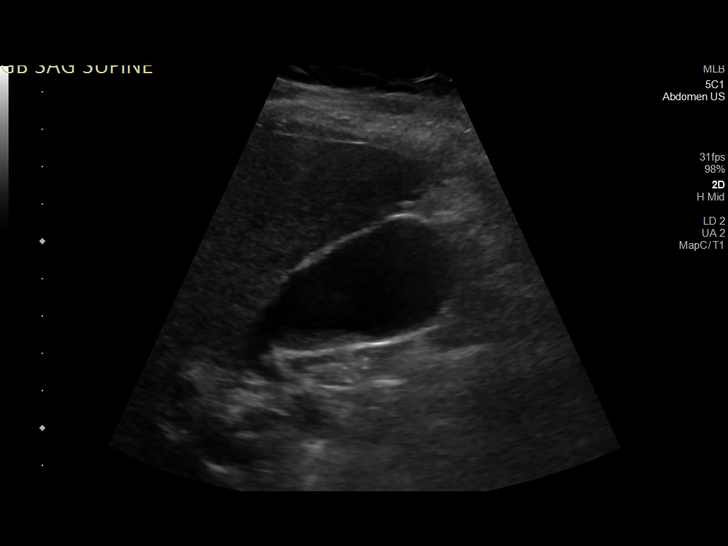
[im 4/39]
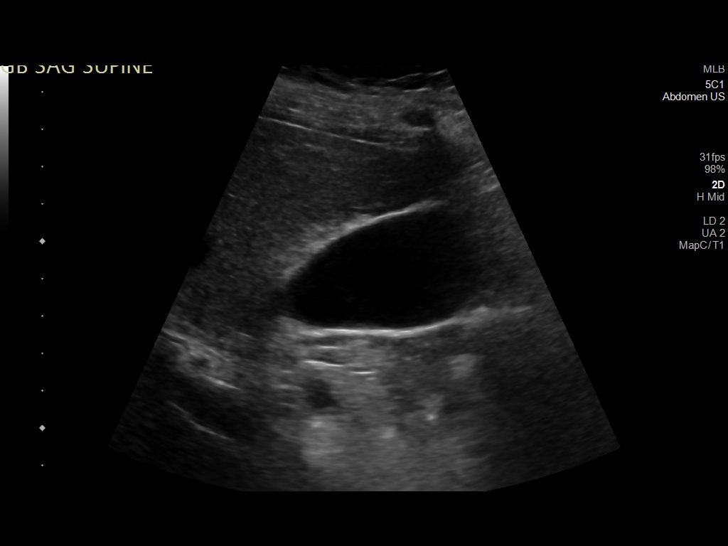
[im 7/39]
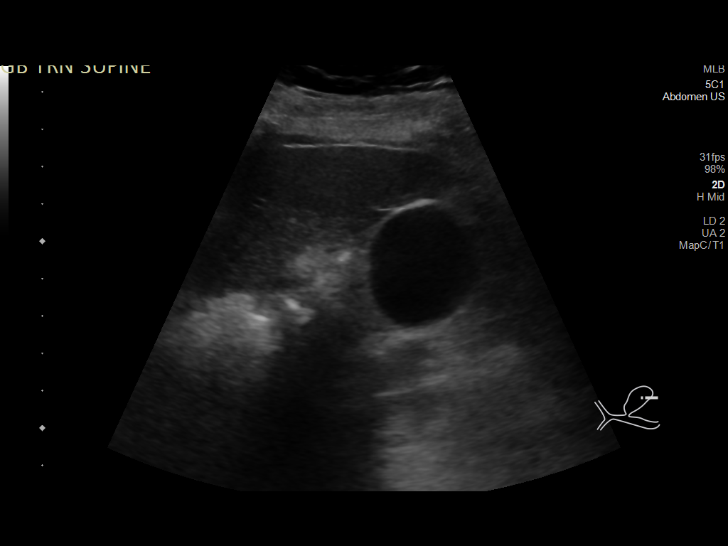
[im 10/39]
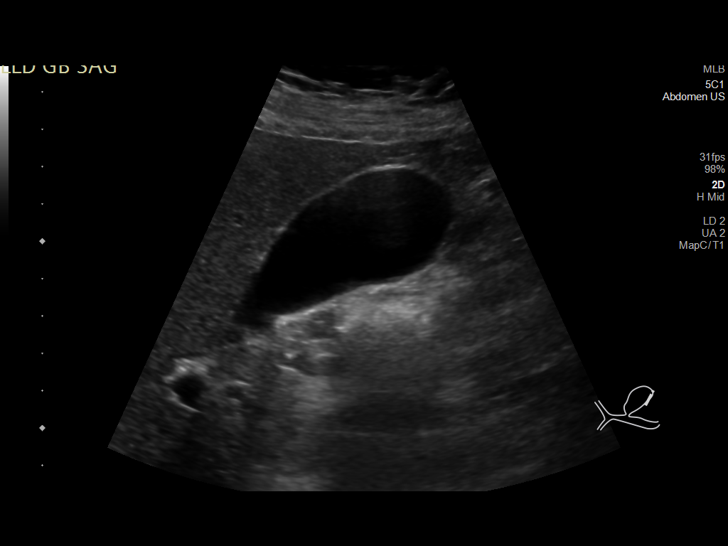
[im 13/39]
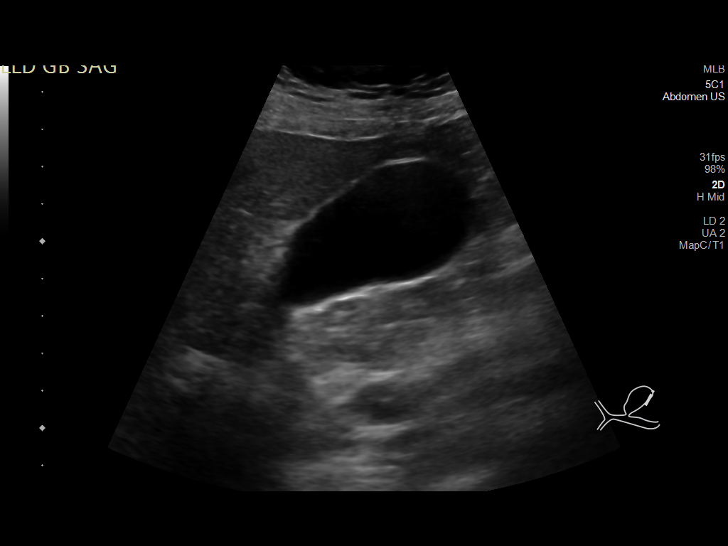
[im 15/39]
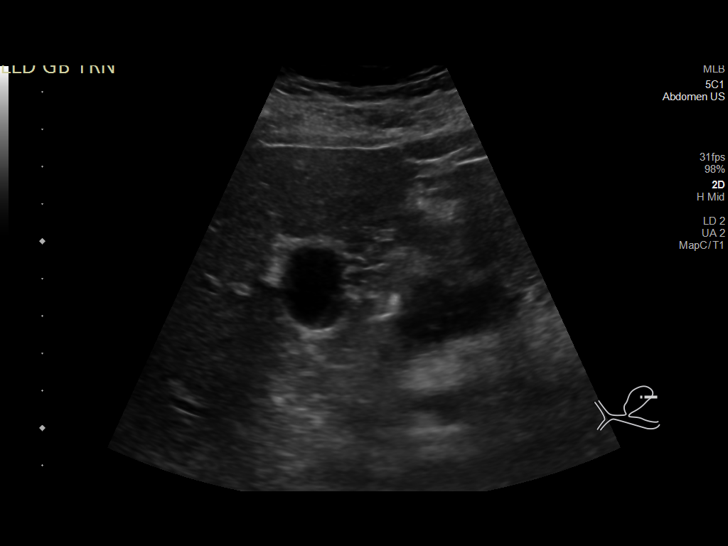
[im 18/39]
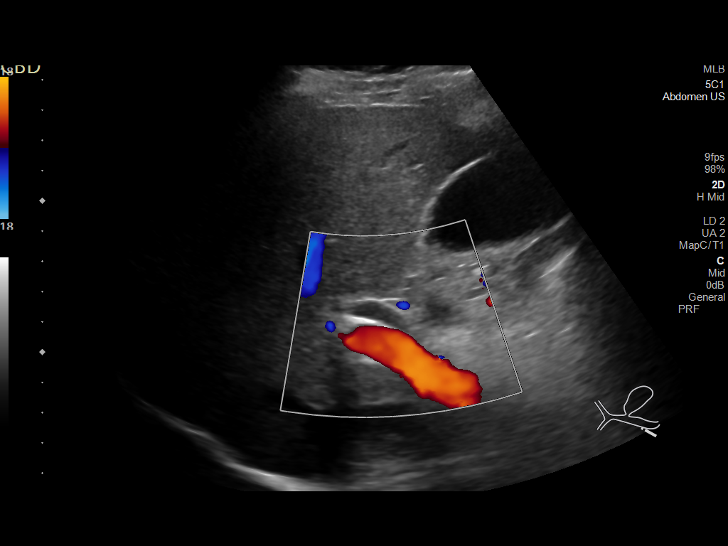
[im 21/39]
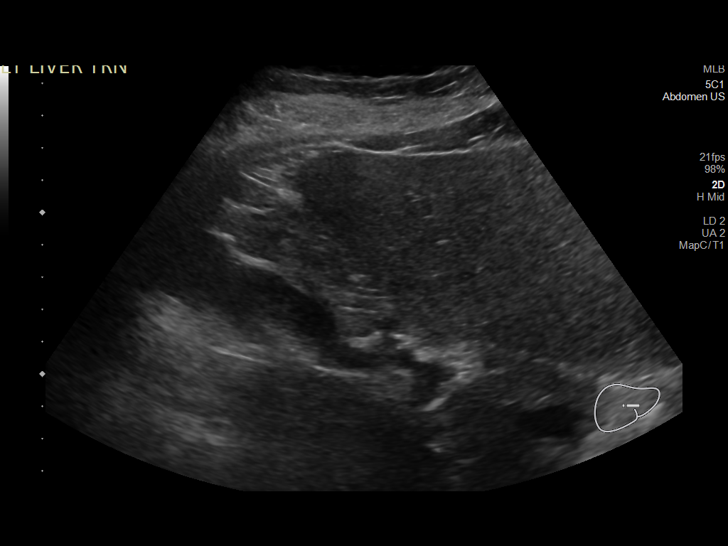
[im 24/39]
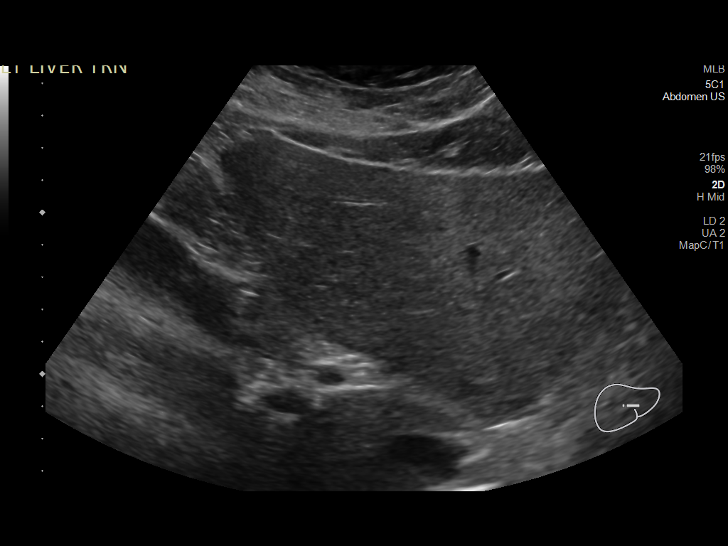
[im 26/39]
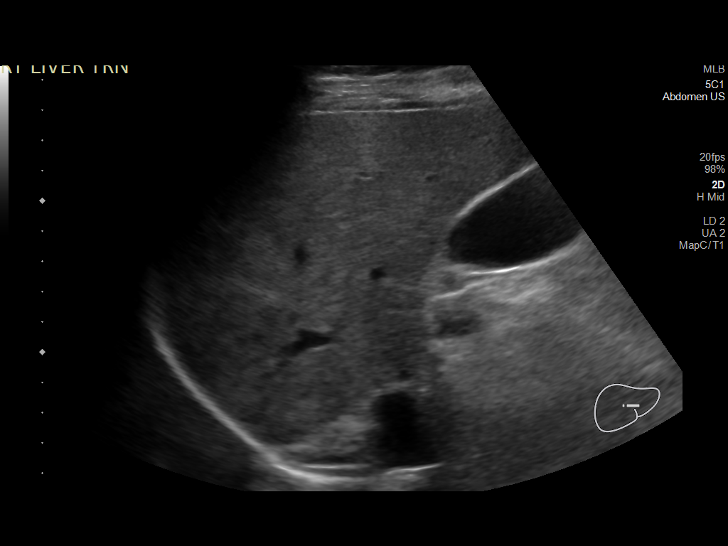
[im 29/39]
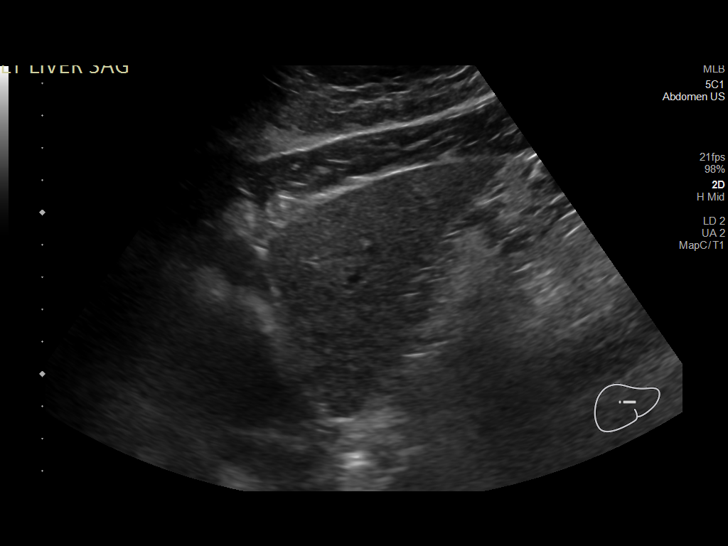
[im 32/39]
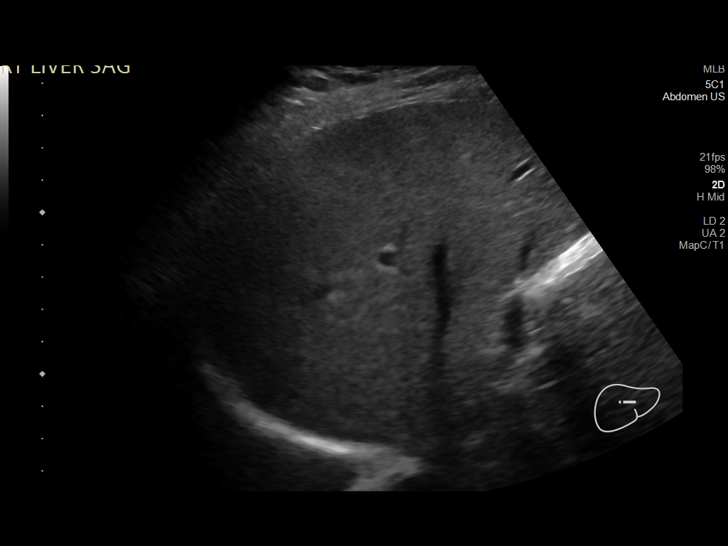
[im 35/39]
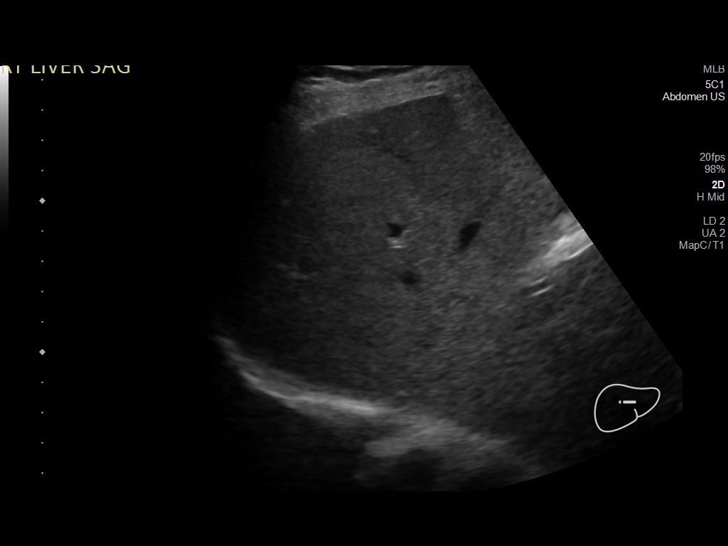
[im 39/39]
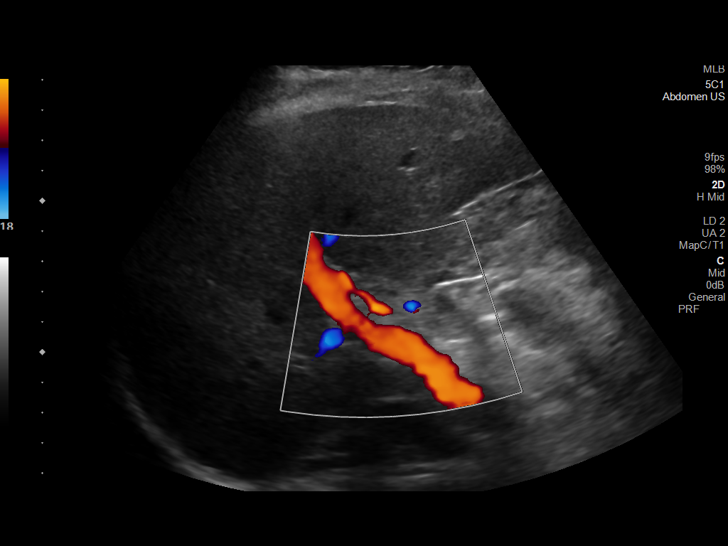

[14 of 25 positions shown; findings below may reference images not displayed]

FINDINGS: Gallbladder:

No gallstones or wall thickening visualized. No sonographic Murphy
sign noted by sonographer.

Common bile duct:

Diameter: 6.0 mm

Liver:

No focal lesion identified. Within normal limits in parenchymal
echogenicity. Portal vein is patent on color Doppler imaging with
normal direction of blood flow towards the liver.

Other: None.
IMPRESSION: Negative right upper quadrant ultrasound.

## 2022-10-13 ENCOUNTER — Encounter (HOSPITAL_BASED_OUTPATIENT_CLINIC_OR_DEPARTMENT_OTHER): Payer: Self-pay | Admitting: Emergency Medicine

## 2022-10-13 ENCOUNTER — Other Ambulatory Visit: Payer: Self-pay

## 2022-10-13 DIAGNOSIS — Z7982 Long term (current) use of aspirin: Secondary | ICD-10-CM | POA: Diagnosis not present

## 2022-10-13 DIAGNOSIS — I10 Essential (primary) hypertension: Secondary | ICD-10-CM | POA: Insufficient documentation

## 2022-10-13 DIAGNOSIS — S0502XA Injury of conjunctiva and corneal abrasion without foreign body, left eye, initial encounter: Secondary | ICD-10-CM | POA: Insufficient documentation

## 2022-10-13 DIAGNOSIS — Z87891 Personal history of nicotine dependence: Secondary | ICD-10-CM | POA: Diagnosis not present

## 2022-10-13 DIAGNOSIS — Z8543 Personal history of malignant neoplasm of ovary: Secondary | ICD-10-CM | POA: Insufficient documentation

## 2022-10-13 DIAGNOSIS — X58XXXA Exposure to other specified factors, initial encounter: Secondary | ICD-10-CM | POA: Diagnosis not present

## 2022-10-13 DIAGNOSIS — Z79899 Other long term (current) drug therapy: Secondary | ICD-10-CM | POA: Insufficient documentation

## 2022-10-13 MED ORDER — TETRACAINE HCL 0.5 % OP SOLN
2.0000 [drp] | Freq: Once | OPHTHALMIC | Status: AC
  Administered 2022-10-14: 2 [drp] via OPHTHALMIC
  Filled 2022-10-13: qty 4

## 2022-10-13 MED ORDER — FLUORESCEIN SODIUM 1 MG OP STRP
1.0000 | ORAL_STRIP | Freq: Once | OPHTHALMIC | Status: AC
  Administered 2022-10-14: 1 via OPHTHALMIC
  Filled 2022-10-13: qty 1

## 2022-10-13 NOTE — ED Triage Notes (Signed)
Hit in left eye with tree branch while working in yard.  Happened around lunchtime today, reports no improvement in pain since then. Took xanax at home

## 2022-10-14 ENCOUNTER — Emergency Department (HOSPITAL_BASED_OUTPATIENT_CLINIC_OR_DEPARTMENT_OTHER)
Admission: EM | Admit: 2022-10-14 | Discharge: 2022-10-14 | Disposition: A | Discharge status: Home / Self Care | Payer: Federal, State, Local not specified - PPO | Attending: Emergency Medicine | Admitting: Emergency Medicine

## 2022-10-14 DIAGNOSIS — S0502XA Injury of conjunctiva and corneal abrasion without foreign body, left eye, initial encounter: Secondary | ICD-10-CM

## 2022-10-14 MED ORDER — HYDROCODONE-ACETAMINOPHEN 5-325 MG PO TABS
1.0000 | ORAL_TABLET | Freq: Once | ORAL | Status: AC
  Administered 2022-10-14: 1 via ORAL
  Filled 2022-10-14: qty 1

## 2022-10-14 MED ORDER — ERYTHROMYCIN 5 MG/GM OP OINT
TOPICAL_OINTMENT | Freq: Four times a day (QID) | OPHTHALMIC | Status: DC
  Administered 2022-10-14: 1 via OPHTHALMIC
  Filled 2022-10-14: qty 3.5

## 2022-10-14 MED ORDER — HYDROCODONE-ACETAMINOPHEN 5-325 MG PO TABS: 1.0000 | ORAL_TABLET | Freq: Four times a day (QID) | ORAL | 0 refills | Status: AC | PRN

## 2022-10-14 NOTE — ED Provider Notes (Signed)
[facility] Provider Note: Lowella Dell, MD, FACEP  CSN: [id number] MRN: [id number] ARRIVAL: 10/13/22 at 2226 ROOM: DB015/DB015   CHIEF COMPLAINT  Eye Problem   HISTORY OF PRESENT ILLNESS  10/14/22 12:57 AM Mariah Allison is a 62 y.o. female who was hit in the left eye with a tree branch while working in her her yard.  This occurred about noon yesterday.  She continues to have pain in her left eye which she rates as a 9 out of 10.  It is somewhat worse with exposure to light.   Past Medical History:  Diagnosis Date   Anxiety    Cancer    Ovarian   Chest pressure    GAD (generalized anxiety disorder)    Herpes simplex type 1 infection    Hypertension    Rhinitis    SOB (shortness of breath)     Past Surgical History:  Procedure Laterality Date   ABDOMINAL HYSTERECTOMY     CHOLECYSTECTOMY N/A 08/21/2020   Procedure: LAPAROSCOPIC CHOLECYSTECTOMY;  Surgeon: Abigail Miyamoto, MD;  Location: [facility] OR;  Service: General;  Laterality: N/A;   KNEE SURGERY     TONSILLECTOMY     WISDOM TOOTH EXTRACTION      Family History  Problem Relation Age of Onset   Atrial fibrillation Mother    Heart attack Father 66   Atrial fibrillation Sister     Social History   Tobacco Use   Smoking status: Former    Packs/day: 0.20    Years: 10.00    Additional pack years: 0.00    Total pack years: 2.00    Types: Cigarettes   Smokeless tobacco: Never  Vaping Use   Vaping Use: Never used  Substance Use Topics   Alcohol use: Yes    Comment: social   Drug use: No    Prior to Admission medications   Medication Sig Start Date End Date Taking? Authorizing Provider  HYDROcodone-acetaminophen (NORCO) 5-325 MG tablet Take 1-2 tablets by mouth every 6 (six) hours as needed for severe pain. 10/14/22  Yes Atthew Coutant, MD  ALPRAZolam Prudy Feeler) 0.5 MG tablet Take 0.25 mg by mouth 2 (two) times daily as needed for anxiety.    [provider]  aspirin EC 81 MG tablet Take 81 mg by  mouth daily.    [provider]  Cholecalciferol (VITAMIN D3) 50 MCG (2000 UT) TABS Take 2,000 Units by mouth daily.    [provider]  hydrochlorothiazide (HYDRODIURIL) 25 MG tablet Take 25 mg by mouth daily.    [provider]  ibuprofen (ADVIL,MOTRIN) 800 MG tablet Take 1 tablet (800 mg total) by mouth 3 (three) times daily. Patient taking differently: Take 400-600 mg by mouth daily as needed for headache. 08/25/12   Pollina, Canary Brim, MD  Krill Oil 500 MG CAPS Take 500 mg by mouth daily.    [provider]  loratadine (CLARITIN) 10 MG tablet Take 10 mg by mouth daily.    [provider]  Magnesium 250 MG TABS Take 250 mg by mouth daily.    [provider]  metoprolol succinate (TOPROL-XL) 50 MG 24 hr tablet Take 50 mg by mouth daily. Take with or immediately following a meal.    [provider]  omeprazole (PRILOSEC) 40 MG capsule Take 40 mg by mouth daily. 07/10/20   [provider]    Allergies Penicillins, Sulfa antibiotics, and Codeine   REVIEW OF SYSTEMS  Negative except as noted here or  in the History of Present Illness.   PHYSICAL EXAMINATION  Initial Vital Signs Blood pressure (!) 149/95, pulse 69, temperature 97.6 F (36.4 C), resp. rate 20, SpO2 94 %.  Examination General: Well-developed, well-nourished female in no acute distress; appearance consistent with age of record HENT: normocephalic; atraumatic Eyes: pupils equal, round and reactive to light; extraocular muscles intact; left medial conjunctival injection; superficial corneal abrasion seen on fluorescein exam; no hyphema Neck: supple Heart: regular rate and rhythm Lungs: clear to auscultation bilaterally Abdomen: soft; nondistended; nontender; bowel sounds present Extremities: No deformity; full range of motion Neurologic: Awake, alert and oriented; motor function intact in all extremities and symmetric; no facial droop Skin: Warm and  dry Psychiatric: Normal mood and affect   RESULTS  Summary of this visit's results, reviewed and interpreted by myself:   EKG Interpretation  Date/Time:    Ventricular Rate:    PR Interval:    QRS Duration:   QT Interval:    QTC Calculation:   R Axis:     Text Interpretation:         Laboratory Studies: No results found for this or any previous visit (from the past 24 hour(s)). Imaging Studies: No results found.  ED COURSE and MDM  Nursing notes, initial and subsequent vitals signs, including pulse oximetry, reviewed and interpreted by myself.  Vitals:   10/13/22 2232  BP: (!) 149/95  Pulse: 69  Resp: 20  Temp: 97.6 F (36.4 C)  SpO2: 94%   Medications  tetracaine (PONTOCAINE) 0.5 % ophthalmic solution 2 drop (has no administration in time range)  fluorescein ophthalmic strip 1 strip (has no administration in time range)  erythromycin ophthalmic ointment (has no administration in time range)  HYDROcodone-acetaminophen (NORCO/VICODIN) 5-325 MG per tablet 1 tablet (has no administration in time range)   Corneal abrasion seen on exam.  We will treat the patient's pain and provide erythromycin ointment to help soothe her eye.  We will refer to the ophthalmologist on-call to ensure proper healing.   PROCEDURES  Procedures   ED DIAGNOSES     ICD-10-CM   1. Left corneal abrasion, initial encounter  S05.Blanca Friend, MD 10/14/22 [phone]

## 2022-10-14 NOTE — ED Notes (Signed)
Pt verbalized understanding of d/c instructions, meds, and followup care. Denies questions. VSS, no distress noted. Steady gait to exit with all belongings.  ?
